# Patient Record
Sex: Male | Born: 1985
Health system: Southern US, Community
[De-identification: ages and names within clinical notes are randomized; demographics above are authoritative.]

---

## 2011-10-15 ENCOUNTER — Emergency Department (HOSPITAL_COMMUNITY): Payer: No Typology Code available for payment source

## 2011-10-15 ENCOUNTER — Emergency Department (HOSPITAL_COMMUNITY)
Admission: EM | Admit: 2011-10-15 | Discharge: 2011-10-15 | Disposition: A | Discharge status: Home / Self Care | Payer: No Typology Code available for payment source | Attending: Emergency Medicine | Admitting: Emergency Medicine

## 2011-10-15 ENCOUNTER — Encounter (HOSPITAL_COMMUNITY): Payer: Self-pay | Admitting: Emergency Medicine

## 2011-10-15 DIAGNOSIS — M549 Dorsalgia, unspecified: Secondary | ICD-10-CM | POA: Insufficient documentation

## 2011-10-15 DIAGNOSIS — M7918 Myalgia, other site: Secondary | ICD-10-CM

## 2011-10-15 DIAGNOSIS — M542 Cervicalgia: Secondary | ICD-10-CM | POA: Insufficient documentation

## 2011-10-15 DIAGNOSIS — Y92410 Unspecified street and highway as the place of occurrence of the external cause: Secondary | ICD-10-CM | POA: Insufficient documentation

## 2011-10-15 DIAGNOSIS — IMO0001 Reserved for inherently not codable concepts without codable children: Secondary | ICD-10-CM | POA: Insufficient documentation

## 2011-10-15 MED ORDER — IBUPROFEN 800 MG PO TABS: 800.0000 mg | ORAL_TABLET | Freq: Three times a day (TID) | ORAL | Status: AC

## 2011-10-15 MED ORDER — OXYCODONE-ACETAMINOPHEN 5-325 MG PO TABS: 2.0000 | ORAL_TABLET | Freq: Four times a day (QID) | ORAL | Status: AC | PRN
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
## 2011-10-15 NOTE — ED Notes (Signed)
Patient transported to X-ray 
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
## 2017-12-30 DIAGNOSIS — J029 Acute pharyngitis, unspecified: Secondary | ICD-10-CM | POA: Diagnosis not present

## 2018-01-01 DIAGNOSIS — L700 Acne vulgaris: Secondary | ICD-10-CM | POA: Diagnosis not present

## 2018-04-05 DIAGNOSIS — J029 Acute pharyngitis, unspecified: Secondary | ICD-10-CM | POA: Diagnosis not present

## 2019-08-11 ENCOUNTER — Ambulatory Visit: Payer: BLUE CROSS/BLUE SHIELD | Admitting: Nurse Practitioner

## 2019-08-12 ENCOUNTER — Ambulatory Visit: Payer: BLUE CROSS/BLUE SHIELD | Admitting: Nurse Practitioner

## 2019-10-07 ENCOUNTER — Ambulatory Visit (INDEPENDENT_AMBULATORY_CARE_PROVIDER_SITE_OTHER): Payer: BLUE CROSS/BLUE SHIELD | Admitting: Nurse Practitioner

## 2019-10-07 ENCOUNTER — Encounter: Payer: Self-pay | Admitting: Nurse Practitioner

## 2019-10-07 ENCOUNTER — Other Ambulatory Visit: Payer: Self-pay

## 2019-10-07 VITALS — BP 110/72 | HR 79 | Temp 97.6°F | Resp 18 | Ht 72.0 in | Wt 185.6 lb

## 2019-10-07 DIAGNOSIS — Z23 Encounter for immunization: Secondary | ICD-10-CM | POA: Diagnosis not present

## 2019-10-07 DIAGNOSIS — Z7689 Persons encountering health services in other specified circumstances: Secondary | ICD-10-CM

## 2019-10-07 DIAGNOSIS — Z13228 Encounter for screening for other metabolic disorders: Secondary | ICD-10-CM

## 2019-10-07 DIAGNOSIS — Z5320 Procedure and treatment not carried out because of patient's decision for unspecified reasons: Secondary | ICD-10-CM | POA: Diagnosis not present

## 2019-10-07 DIAGNOSIS — Z13220 Encounter for screening for lipoid disorders: Secondary | ICD-10-CM

## 2019-10-07 DIAGNOSIS — Z0000 Encounter for general adult medical examination without abnormal findings: Secondary | ICD-10-CM | POA: Diagnosis not present

## 2019-10-07 NOTE — Progress Notes (Signed)
New Patient Office Visit  Subjective:  Patient ID: Roger Torres, male    DOB: 1986-03-03  Age: 34 y.o. MRN: [id number]  CC:  Chief Complaint  Patient presents with  . Establish Care    NP    HPI Roger Torres is a 34 year old male presenting to establish care. Health history and medications discussed and reviewed. He is married and wife is having a girl in June. He has no medical concerns. No cp/ct, gu/gi sxs, pain, edema, sob, or falls.   Discussed risk/importance of male testicular exam yearly recommend home monthly DM retinopathy, yearly microalbumin, foot examination Protective sex/birth control  Flu:02/2019 completed Tdap:administered today no reaction after 10 minutes HIV lab declined: wife only sexual partner Etoh/Drug:neg Depression:neg  History reviewed. No pertinent past medical history.  History reviewed. No pertinent surgical history.  History reviewed. No pertinent family history.  Social History   Socioeconomic History  . Marital status: Single    Spouse name: Not on file  . Number of children: Not on file  . Years of education: Not on file  . Highest education level: Not on file  Occupational History  . Not on file  Tobacco Use  . Smoking status: Never Smoker  . Smokeless tobacco: Never Used  Substance and Sexual Activity  . Alcohol use: Not Currently  . Drug use: Never  . Sexual activity: Yes    Partners: Female  Other Topics Concern  . Not on file  Social History Narrative  . Not on file   Social Determinants of Health   Financial Resource Strain:   . Difficulty of Paying Living Expenses:   Food Insecurity:   . Worried About Programme researcher, broadcasting/film/video in the Last Year:   . Barista in the Last Year:   Transportation Needs:   . Freight forwarder (Medical):   Marland Kitchen Lack of Transportation (Non-Medical):   Physical Activity:   . Days of Exercise per Week:   . Minutes of Exercise per Session:   Stress:   . Feeling of Stress :     Social Connections:   . Frequency of Communication with Friends and Family:   . Frequency of Social Gatherings with Friends and Family:   . Attends Religious Services:   . Active Member of Clubs or Organizations:   . Attends Banker Meetings:   Marland Kitchen Marital Status:   Intimate Partner Violence:   . Fear of Current or Ex-Partner:   . Emotionally Abused:   Marland Kitchen Physically Abused:   . Sexually Abused:     No Known Allergies  ROS Review of Systems  All other systems reviewed and are negative.   Objective:   Today's Vitals: BP 110/72 (BP Location: Left Arm, Patient Position: Sitting, Cuff Size: Normal)   Pulse 79   Temp 97.6 F (36.4 C) (Temporal)   Resp 18   Ht 6' (1.829 m)   Wt 185 lb 9.6 oz (84.2 kg)   SpO2 100%   BMI 25.17 kg/m   Physical Exam Vitals and nursing note reviewed.  Constitutional:      Appearance: Normal appearance. He is well-developed and well-groomed.  HENT:     Head: Normocephalic.     Right Ear: Hearing, tympanic membrane, ear canal and external ear normal.     Left Ear: Hearing, ear canal and external ear normal. There is impacted cerumen.     Ears:     Comments: Cerumen impaction in left ear cannot see  TM pt choice for otc treatment    Nose: Nose normal.     Mouth/Throat:     Mouth: Mucous membranes are moist.     Pharynx: Oropharynx is clear.  Eyes:     General: Lids are normal. Lids are everted, no foreign bodies appreciated.     Extraocular Movements: Extraocular movements intact.     Conjunctiva/sclera: Conjunctivae normal.     Pupils: Pupils are equal, round, and reactive to light.  Neck:     Thyroid: No thyroid mass, thyromegaly or thyroid tenderness.     Vascular: No carotid bruit or JVD.     Trachea: Trachea and phonation normal.  Cardiovascular:     Rate and Rhythm: Normal rate and regular rhythm.     Pulses: Normal pulses.     Heart sounds: Normal heart sounds, S1 normal and S2 normal.  Pulmonary:     Effort: Pulmonary  effort is normal.     Breath sounds: Normal breath sounds.  Chest:     Chest wall: No deformity.  Abdominal:     General: Abdomen is flat. Bowel sounds are normal.     Palpations: Abdomen is soft.     Tenderness: There is no abdominal tenderness.  Musculoskeletal:        General: No swelling. Normal range of motion.     Cervical back: Normal range of motion and neck supple.     Right lower leg: No edema.     Left lower leg: No edema.  Lymphadenopathy:     Head:     Right side of head: No submental, submandibular, tonsillar, preauricular, posterior auricular or occipital adenopathy.     Left side of head: No submental, submandibular, tonsillar, preauricular, posterior auricular or occipital adenopathy.     Cervical:     Right cervical: No superficial cervical adenopathy.    Left cervical: No superficial cervical adenopathy.  Skin:    General: Skin is warm and dry.     Capillary Refill: Capillary refill takes less than 2 seconds.  Neurological:     General: No focal deficit present.     Mental Status: He is alert and oriented to person, place, and time.  Psychiatric:        Attention and Perception: Attention normal.        Mood and Affect: Mood normal.        Speech: Speech normal.        Behavior: Behavior normal. Behavior is cooperative.        Thought Content: Thought content normal.        Cognition and Memory: Cognition normal.        Judgment: Judgment normal.     Assessment & Plan:   Problem List Items Addressed This Visit    None    Visit Diagnoses    Encounter to establish care with new doctor    -  Primary   Relevant Orders   Lipid panel   COMPLETE METABOLIC PANEL WITH GFR   CBC with Differential/Platelet   Lipid screening       Relevant Orders   Lipid panel   Screening for metabolic disorder       Relevant Orders   COMPLETE METABOLIC PANEL WITH GFR   CBC with Differential/Platelet   HIV screening declined       Need for Tdap vaccination        Relevant Orders   Tdap vaccine greater than or equal to 7yo IM (Completed)  established care labs pending Generally good health Follow up for yearly wellness.   No outpatient encounter medications on file as of 10/07/2019.   No facility-administered encounter medications on file as of 10/07/2019.    Follow-up: Return in about 1 year (around 10/06/2020) for ape labs one week prior.   Annie Main, FNP

## 2019-10-08 LAB — COMPLETE METABOLIC PANEL WITH GFR
AG Ratio: 1.3 (calc) (ref 1.0–2.5)
ALT: 21 U/L (ref 9–46)
AST: 20 U/L (ref 10–40)
Albumin: 4.1 g/dL (ref 3.6–5.1)
Alkaline phosphatase (APISO): 86 U/L (ref 36–130)
BUN: 17 mg/dL (ref 7–25)
CO2: 29 mmol/L (ref 20–32)
Calcium: 9.6 mg/dL (ref 8.6–10.3)
Chloride: 105 mmol/L (ref 98–110)
Creat: 1.31 mg/dL (ref 0.60–1.35)
GFR, Est African American: 82 mL/min/{1.73_m2} (ref >=60)
GFR, Est Non African American: 71 mL/min/{1.73_m2} (ref >=60)
Globulin: 3.1 g/dL (calc) (ref 1.9–3.7)
Glucose, Bld: 96 mg/dL (ref 65–99)
Potassium: 4.4 mmol/L (ref 3.5–5.3)
Sodium: 141 mmol/L (ref 135–146)
Total Bilirubin: 0.5 mg/dL (ref 0.2–1.2)
Total Protein: 7.2 g/dL (ref 6.1–8.1)

## 2019-10-08 LAB — CBC WITH DIFFERENTIAL/PLATELET
Absolute Monocytes: 296 cells/uL (ref 200–950)
Basophils Absolute: 20 cells/uL (ref 0–200)
Basophils Relative: 0.5 %
Eosinophils Absolute: 39 cells/uL (ref 15–500)
Eosinophils Relative: 1 %
HCT: 43.2 % (ref 38.5–50.0)
Hemoglobin: 14.3 g/dL (ref 13.2–17.1)
Lymphs Abs: 1365 cells/uL (ref 850–3900)
MCH: 28.8 pg (ref 27.0–33.0)
MCHC: 33.1 g/dL (ref 32.0–36.0)
MCV: 86.9 fL (ref 80.0–100.0)
MPV: 11.2 fL (ref 7.5–12.5)
Monocytes Relative: 7.6 %
Neutro Abs: 2180 cells/uL (ref 1500–7800)
Neutrophils Relative %: 55.9 %
Platelets: 189 10*3/uL (ref 140–400)
RBC: 4.97 10*6/uL (ref 4.20–5.80)
RDW: 13.1 % (ref 11.0–15.0)
Total Lymphocyte: 35 %
WBC: 3.9 10*3/uL (ref 3.8–10.8)

## 2019-10-08 LAB — LIPID PANEL
Cholesterol: 172 mg/dL (ref <200)
HDL: 47 mg/dL (ref >=40)
LDL Cholesterol (Calc): 111 mg/dL (calc) — ABNORMAL HIGH
Non-HDL Cholesterol (Calc): 125 mg/dL (calc) (ref <130)
Total CHOL/HDL Ratio: 3.7 (calc) (ref <5.0)
Triglycerides: 58 mg/dL (ref <150)

## 2019-10-11 NOTE — Progress Notes (Signed)
Labs good

## 2019-10-12 ENCOUNTER — Telehealth (INDEPENDENT_AMBULATORY_CARE_PROVIDER_SITE_OTHER): Payer: BLUE CROSS/BLUE SHIELD | Admitting: Nurse Practitioner

## 2019-10-12 DIAGNOSIS — Z4800 Encounter for change or removal of nonsurgical wound dressing: Secondary | ICD-10-CM

## 2019-10-12 NOTE — Progress Notes (Signed)
erroneous

## 2019-11-05 ENCOUNTER — Ambulatory Visit (INDEPENDENT_AMBULATORY_CARE_PROVIDER_SITE_OTHER): Payer: BLUE CROSS/BLUE SHIELD | Admitting: Family Medicine

## 2019-11-05 ENCOUNTER — Encounter: Payer: Self-pay | Admitting: Family Medicine

## 2019-11-05 ENCOUNTER — Other Ambulatory Visit: Payer: Self-pay

## 2019-11-05 VITALS — BP 110/64 | HR 76 | Temp 97.0°F | Resp 14 | Ht 72.0 in | Wt 187.0 lb

## 2019-11-05 DIAGNOSIS — B360 Pityriasis versicolor: Secondary | ICD-10-CM | POA: Diagnosis not present

## 2019-11-05 DIAGNOSIS — H6122 Impacted cerumen, left ear: Secondary | ICD-10-CM | POA: Diagnosis not present

## 2019-11-05 MED ORDER — KETOCONAZOLE 2 % EX SHAM: 1.0000 "application " | MEDICATED_SHAMPOO | CUTANEOUS | 5 refills | Status: AC

## 2019-11-05 NOTE — Progress Notes (Signed)
   Subjective:    Patient ID: Roger Torres, male    DOB: October 10, 1985, 34 y.o.   MRN: [id number]  HPI Patient presents today with 2 concerns.  #1 he has a patch of hypopigmented skin on the back of his neck.  He has sharply demarcated borders and it appears to be scar tissue from a previously ingrown hair.  However he also has hypopigmented patches of skin on his sternum and on his shoulders consistent with tinea versicolor.  I am not sure if the 2 are related.  Second concern is hearing loss in his left ear due to a cerumen impaction.  The patient clearly has a cerumen impaction in his left ear.       No past medical history on file. No past surgical history on file. No current outpatient medications on file prior to visit.   No current facility-administered medications on file prior to visit.   No Known Allergies   Review of Systems     Objective:   Physical Exam Constitutional:      Appearance: Normal appearance.  Cardiovascular:     Rate and Rhythm: Normal rate and regular rhythm.  Pulmonary:     Effort: Pulmonary effort is normal.     Breath sounds: Normal breath sounds.  Skin:    Findings: Rash present.       Neurological:     Mental Status: He is alert.           Assessment & Plan:  Tinea versicolor  Impacted cerumen of left ear  I believe the patch of skin on his neck is hypopigmented skin due to scarring.  I believe the patches of skin on his shoulders and on his sternum are tinea versicolor.  I will treat that with ketoconazole shampoo applied twice a week until healed.  Cerumen impaction was removed with irrigation and lavage.

## 2020-01-28 ENCOUNTER — Other Ambulatory Visit: Payer: Self-pay

## 2020-01-28 ENCOUNTER — Ambulatory Visit (INDEPENDENT_AMBULATORY_CARE_PROVIDER_SITE_OTHER): Payer: BLUE CROSS/BLUE SHIELD | Admitting: Family Medicine

## 2020-01-28 VITALS — BP 130/80 | HR 98 | Temp 99.8°F | Ht 72.0 in | Wt 187.0 lb

## 2020-01-28 DIAGNOSIS — J029 Acute pharyngitis, unspecified: Secondary | ICD-10-CM | POA: Diagnosis not present

## 2020-01-28 DIAGNOSIS — J329 Chronic sinusitis, unspecified: Secondary | ICD-10-CM | POA: Diagnosis not present

## 2020-01-28 DIAGNOSIS — J310 Chronic rhinitis: Secondary | ICD-10-CM

## 2020-01-28 MED ORDER — AMOXICILLIN 875 MG PO TABS: 875.0000 mg | ORAL_TABLET | Freq: Two times a day (BID) | ORAL | 0 refills | Status: AC

## 2020-01-28 NOTE — Progress Notes (Signed)
   Subjective:    Patient ID: Roger Torres, male    DOB: Dec 22, 1985, 34 y.o.   MRN: [id number]  HPI Patient is a very pleasant 34 year old African-American gentleman who presents today complaining of a sinus infection.  He states that for the last week he has had a dull headache in his forehead.  He has a lot of rhinorrhea and head congestion.  He reports postnasal drip causing a sore throat.  He reports a nonproductive cough.  He reports fatigue.  He denies any fevers.  He is taking Mucinex with no relief.  Physical exam today is significant for erythema in the posterior oropharynx and nasal congestion No past medical history on file. No past surgical history on file. Current Outpatient Medications on File Prior to Visit  Medication Sig Dispense Refill  . ketoconazole (NIZORAL) 2 % shampoo Apply 1 application topically 2 (two) times a week. 120 mL 5   No current facility-administered medications on file prior to visit.   No Known Allergies   Review of Systems     Objective:   Physical Exam Constitutional:      Appearance: Normal appearance.  HENT:     Right Ear: Tympanic membrane and ear canal normal.     Left Ear: Tympanic membrane and ear canal normal.     Nose: Congestion and rhinorrhea present.     Mouth/Throat:     Pharynx: Posterior oropharyngeal erythema present.  Eyes:     Extraocular Movements: Extraocular movements intact.     Conjunctiva/sclera: Conjunctivae normal.  Cardiovascular:     Rate and Rhythm: Normal rate and regular rhythm.     Heart sounds: Normal heart sounds.  Pulmonary:     Effort: Pulmonary effort is normal. No respiratory distress.     Breath sounds: Normal breath sounds. No wheezing or rales.  Lymphadenopathy:     Cervical: No cervical adenopathy.  Skin:    Findings: Rash present.       Neurological:     Mental Status: He is alert.           Assessment & Plan:  Pharyngitis, unspecified etiology - Plan: SARS-COV-2 RNA,(COVID-19) QUAL  NAAT, STREP GROUP A AG, W/REFLEX TO CULT  Rhinosinusitis  Patient does not have immunity to Covid.  Therefore I recommended a COVID-19 screen.  Given the erythematous posterior oropharynx I will also check for strep throat.  However his symptoms are more consistent with a viral upper respiratory infection.  Strep test is negative.  I will await the results of the Covid test.  If symptoms persist into next week I will treat the patient for possible secondary sinus infection with amoxicillin 875 mg twice daily.  If Covid test is positive then obviously the patient will need quarantine.

## 2020-01-29 LAB — SARS-COV-2 RNA,(COVID-19) QUALITATIVE NAAT: SARS CoV2 RNA: DETECTED — AB

## 2020-01-30 LAB — CULTURE, GROUP A STREP
MICRO NUMBER:: 10742386
SPECIMEN QUALITY:: ADEQUATE

## 2020-01-30 LAB — STREP GROUP A AG, W/REFLEX TO CULT: Streptococcus, Group A Screen (Direct): NOT DETECTED

## 2020-06-28 DIAGNOSIS — Z20828 Contact with and (suspected) exposure to other viral communicable diseases: Secondary | ICD-10-CM | POA: Diagnosis not present

## 2020-12-25 DIAGNOSIS — L80 Vitiligo: Secondary | ICD-10-CM | POA: Diagnosis not present

## 2020-12-25 DIAGNOSIS — J302 Other seasonal allergic rhinitis: Secondary | ICD-10-CM | POA: Diagnosis not present

## 2021-03-29 DIAGNOSIS — Z3009 Encounter for other general counseling and advice on contraception: Secondary | ICD-10-CM | POA: Diagnosis not present

## 2021-06-06 DIAGNOSIS — J069 Acute upper respiratory infection, unspecified: Secondary | ICD-10-CM | POA: Diagnosis not present

## 2021-06-11 DIAGNOSIS — Z1331 Encounter for screening for depression: Secondary | ICD-10-CM | POA: Diagnosis not present

## 2021-06-11 DIAGNOSIS — Z0000 Encounter for general adult medical examination without abnormal findings: Secondary | ICD-10-CM | POA: Diagnosis not present

## 2021-06-11 DIAGNOSIS — Z1339 Encounter for screening examination for other mental health and behavioral disorders: Secondary | ICD-10-CM | POA: Diagnosis not present

## 2021-06-11 DIAGNOSIS — Z114 Encounter for screening for human immunodeficiency virus [HIV]: Secondary | ICD-10-CM | POA: Diagnosis not present

## 2021-06-11 DIAGNOSIS — Z6828 Body mass index (BMI) 28.0-28.9, adult: Secondary | ICD-10-CM | POA: Diagnosis not present

## 2021-06-12 DIAGNOSIS — Z113 Encounter for screening for infections with a predominantly sexual mode of transmission: Secondary | ICD-10-CM | POA: Diagnosis not present

## 2021-06-12 DIAGNOSIS — Z114 Encounter for screening for human immunodeficiency virus [HIV]: Secondary | ICD-10-CM | POA: Diagnosis not present
